# Patient Record
Sex: Female | Born: 1995 | Race: Black or African American | Hispanic: No | Marital: Single | Smoking: Never smoker
Health system: Southern US, Community
[De-identification: ages and names within clinical notes are randomized; demographics above are authoritative.]

---

## 2015-10-29 ENCOUNTER — Emergency Department
Admission: EM | Admit: 2015-10-29 | Discharge: 2015-10-29 | Disposition: A | Discharge status: Home / Self Care | Payer: Medicaid Other | Attending: Emergency Medicine | Admitting: Emergency Medicine

## 2015-10-29 ENCOUNTER — Encounter: Payer: Self-pay | Admitting: Emergency Medicine

## 2015-10-29 DIAGNOSIS — K529 Noninfective gastroenteritis and colitis, unspecified: Secondary | ICD-10-CM

## 2015-10-29 DIAGNOSIS — O9989 Other specified diseases and conditions complicating pregnancy, childbirth and the puerperium: Secondary | ICD-10-CM | POA: Diagnosis present

## 2015-10-29 DIAGNOSIS — O210 Mild hyperemesis gravidarum: Secondary | ICD-10-CM | POA: Insufficient documentation

## 2015-10-29 DIAGNOSIS — Z3A01 Less than 8 weeks gestation of pregnancy: Secondary | ICD-10-CM | POA: Diagnosis not present

## 2015-10-29 DIAGNOSIS — O99611 Diseases of the digestive system complicating pregnancy, first trimester: Secondary | ICD-10-CM | POA: Insufficient documentation

## 2015-10-29 LAB — URINALYSIS COMPLETE WITH MICROSCOPIC (ARMC ONLY)
Bacteria, UA: NONE SEEN
Bilirubin Urine: NEGATIVE
Glucose, UA: NEGATIVE mg/dL
Nitrite: NEGATIVE
Protein, ur: NEGATIVE mg/dL
Specific Gravity, Urine: 1.025 (ref 1.005–1.030)
pH: 5 (ref 5.0–8.0)

## 2015-10-29 LAB — HCG, QUANTITATIVE, PREGNANCY: hCG, Beta Chain, Quant, S: 68071 m[IU]/mL — ABNORMAL HIGH (ref <5)

## 2015-10-29 LAB — COMPREHENSIVE METABOLIC PANEL
ALT: 14 U/L (ref 14–54)
AST: 17 U/L (ref 15–41)
Albumin: 4.7 g/dL (ref 3.5–5.0)
Alkaline Phosphatase: 50 U/L (ref 38–126)
Anion gap: 8 (ref 5–15)
BUN: 9 mg/dL (ref 6–20)
CO2: 24 mmol/L (ref 22–32)
Calcium: 9.7 mg/dL (ref 8.9–10.3)
Chloride: 105 mmol/L (ref 101–111)
Creatinine, Ser: 0.78 mg/dL (ref 0.44–1.00)
GFR calc Af Amer: >60 mL/min (ref >60)
GFR calc non Af Amer: >60 mL/min (ref >60)
Glucose, Bld: 89 mg/dL (ref 65–99)
Potassium: 3.8 mmol/L (ref 3.5–5.1)
Sodium: 137 mmol/L (ref 135–145)
Total Bilirubin: 0.8 mg/dL (ref 0.3–1.2)
Total Protein: 8.5 g/dL — ABNORMAL HIGH (ref 6.5–8.1)

## 2015-10-29 LAB — CBC
HCT: 44.8 % (ref 35.0–47.0)
Hemoglobin: 15.1 g/dL (ref 12.0–16.0)
MCH: 30.2 pg (ref 26.0–34.0)
MCHC: 33.6 g/dL (ref 32.0–36.0)
MCV: 90 fL (ref 80.0–100.0)
Platelets: 291 10*3/uL (ref 150–440)
RBC: 4.99 MIL/uL (ref 3.80–5.20)
RDW: 12.2 % (ref 11.5–14.5)
WBC: 9.9 10*3/uL (ref 3.6–11.0)

## 2015-10-29 LAB — LIPASE, BLOOD: Lipase: 36 U/L (ref 11–51)

## 2015-10-29 LAB — POCT PREGNANCY, URINE: Preg Test, Ur: POSITIVE — AB

## 2015-10-29 MED ORDER — SODIUM CHLORIDE 0.9 % IV BOLUS (SEPSIS): 1000.0000 mL | Freq: Once | INTRAVENOUS | Status: DC

## 2015-10-29 MED ORDER — METOCLOPRAMIDE HCL 5 MG/ML IJ SOLN: 10.0000 mg | Freq: Once | INTRAMUSCULAR | Status: DC

## 2015-10-29 MED ORDER — SODIUM CHLORIDE 0.9 % IV BOLUS (SEPSIS)
1000.0000 mL | Freq: Once | INTRAVENOUS | Status: AC
  Administered 2015-10-29: 1000 mL via INTRAVENOUS

## 2015-10-29 MED ORDER — METOCLOPRAMIDE HCL 10 MG PO TABS
10.0000 mg | ORAL_TABLET | Freq: Three times a day (TID) | ORAL | Status: AC
Start: 2015-10-29 — End: 2016-10-28

## 2015-10-29 NOTE — Discharge Instructions (Signed)
Return to the emergency room. If you have significant abdominal pain, vaginal bleeding, persistent vomiting, significant bleeding in your bottom, or you feel worse in any way. Follow closely with primary care doctor

## 2015-10-29 NOTE — ED Notes (Signed)
Patient presents to the ED with lower abdominal pain that started this morning, vomiting and diarrhea.  Patient states she is [redacted] weeks pregnant.  Patient reports some nausea and vomiting with her pregnancy,denies prior diarrhea or abdominal pain.  Denies vaginal bleeding.  Patient is in no obvious distress at this time.  Patient reports vomiting more than 10 times today and more than 10 episodes of diarrhea.

## 2015-10-29 NOTE — ED Provider Notes (Addendum)
[facility] Emergency Department Provider Note  ____________________________________________   I have reviewed the triage vital signs and the nursing notes.   HISTORY  Chief Complaint Abdominal Pain; Diarrhea; and Emesis    HPI Ana Spencer is a 20 y.o. female who is in the first trimester pregnancy presents today with nausea vomiting diarrhea. She has had no pelvic pain, she does not have any vaginal bleeding or pregnancy related concerns. She has vomited more than 10 times today and had more than 10 episodes of clear diarrhea. She denies any melena bright red blood per rectum or hematemesis. Multiple acuity members have been here this week with the same complaint. Positive sick contacts. She denies fever. She has received IV fluid from triage and feels much better at this time.  History reviewed. No pertinent past medical history.  There are no active problems to display for this patient.   History reviewed. No pertinent past surgical history.  Current Outpatient Rx  Name  Route  Sig  Dispense  Refill  . metoCLOPramide (REGLAN) 10 MG tablet   Oral   Take 1 tablet (10 mg total) by mouth 3 (three) times daily with meals.   90 tablet   1     Allergies Review of patient's allergies indicates no known allergies.  No family history on file.  Social History Social History  Substance Use Topics  . Smoking status: Never Smoker   . Smokeless tobacco: None  . Alcohol Use: No    Review of Systems Constitutional: No fever/chills Eyes: No visual changes. ENT: No sore throat. No stiff neck no neck pain Cardiovascular: Denies chest pain. Respiratory: Denies shortness of breath. Gastrointestinal:  See history of present illness. Genitourinary: Negative for dysuria. Musculoskeletal: Negative lower extremity swelling Skin: Negative for rash. Neurological: Negative for headaches, focal weakness or numbness. 10-point ROS otherwise  negative.  ____________________________________________   PHYSICAL EXAM:  VITAL SIGNS: ED Triage Vitals  Enc Vitals Group     BP 10/29/15 1844 113/66 mmHg     Pulse Rate 10/29/15 1844 82     Resp 10/29/15 1844 20     Temp 10/29/15 1844 99.1 F (37.3 C)     Temp Source 10/29/15 1844 Oral     SpO2 10/29/15 1844 98 %     Weight 10/29/15 1844 140 lb (63.504 kg)     Height 10/29/15 1844  (1.575 m)     Head Cir --      Peak Flow --      Pain Score 10/29/15 1844 5     Pain Loc --      Pain Edu? --      Excl. in GC? --     Constitutional: Alert and oriented. Well appearing and in no acute distress. Eyes: Conjunctivae are normal. PERRL. EOMI. Head: Atraumatic. Nose: No congestion/rhinnorhea. Mouth/Throat: Mucous membranes are moist.  Oropharynx non-erythematous. Neck: No stridor.   Nontender with no meningismus Cardiovascular: Normal rate, regular rhythm. Grossly normal heart sounds.  Good peripheral circulation. Respiratory: Normal respiratory effort.  No retractions. Lungs CTAB. Abdominal: Soft and nontender. No distention. No guarding no rebound Back:  There is no focal tenderness or step off there is no midline tenderness there are no lesions noted. there is no CVA tenderness Musculoskeletal: No lower extremity tenderness. No joint effusions, no DVT signs strong distal pulses no edema Neurologic:  Normal speech and language. No gross focal neurologic deficits are appreciated.  Skin:  Skin is warm, dry and intact.  No rash noted. Psychiatric: Mood and affect are normal. Speech and behavior are normal.  ____________________________________________   LABS (all labs ordered are listed, but only abnormal results are displayed)  Labs Reviewed  COMPREHENSIVE METABOLIC PANEL - Abnormal; Notable for the following:    Total Protein 8.5 (*)    All other components within normal limits  LIPASE, BLOOD  CBC  URINALYSIS COMPLETEWITH MICROSCOPIC ([facility] ONLY)  HCG, QUANTITATIVE,  PREGNANCY  POC URINE PREG, ED   ____________________________________________  EKG  I personally interpreted any EKGs ordered by me or triage _______________________________________  RADIOLOGY  I reviewed any imaging ordered by me or triage that were performed during my shift ____________________________________________   PROCEDURES  Procedure(s) performed: None  Critical Care performed: None  ____________________________________________   INITIAL IMPRESSION / ASSESSMENT AND PLAN / ED COURSE  Pertinent labs & imaging results that were available during my care of the patient were reviewed by me and considered in my medical decision making (see chart for details).  Very well-appearing woman who has nausea vomiting diarrhea but no evidence of pregnancy related complaint. Already she feels and appears much better. In the last given her pregnancy status and will give her another bolus of IV fluid. Abdominal exam is completely benign on serial exams. ____________________________________________   FINAL CLINICAL IMPRESSION(S) / ED DIAGNOSES  Final diagnoses:  Gastroenteritis      This chart was dictated using voice recognition software.  Despite best efforts to proofread,  errors can occur which can change meaning.     Jeanmarie Plant, MD 10/29/15 1610  Jeanmarie Plant, MD 10/29/15 2039

## 2015-11-02 ENCOUNTER — Encounter: Payer: Self-pay | Admitting: Emergency Medicine

## 2015-11-02 ENCOUNTER — Emergency Department: Payer: Medicaid Other

## 2015-11-02 ENCOUNTER — Emergency Department
Admission: EM | Admit: 2015-11-02 | Discharge: 2015-11-02 | Disposition: A | Discharge status: Home / Self Care | Payer: Medicaid Other | Attending: Emergency Medicine | Admitting: Emergency Medicine

## 2015-11-02 DIAGNOSIS — O209 Hemorrhage in early pregnancy, unspecified: Secondary | ICD-10-CM | POA: Diagnosis present

## 2015-11-02 DIAGNOSIS — Z79899 Other long term (current) drug therapy: Secondary | ICD-10-CM | POA: Insufficient documentation

## 2015-11-02 DIAGNOSIS — Z3A11 11 weeks gestation of pregnancy: Secondary | ICD-10-CM | POA: Diagnosis not present

## 2015-11-02 DIAGNOSIS — N920 Excessive and frequent menstruation with regular cycle: Secondary | ICD-10-CM

## 2015-11-02 DIAGNOSIS — O039 Complete or unspecified spontaneous abortion without complication: Secondary | ICD-10-CM | POA: Insufficient documentation

## 2015-11-02 LAB — URINALYSIS COMPLETE WITH MICROSCOPIC (ARMC ONLY)
BACTERIA UA: NONE SEEN
SPECIFIC GRAVITY, URINE: 1.026 (ref 1.005–1.030)

## 2015-11-02 LAB — CHLAMYDIA/NGC RT PCR (ARMC ONLY)
Chlamydia Tr: DETECTED — AB
N GONORRHOEAE: DETECTED — AB

## 2015-11-02 LAB — WET PREP, GENITAL
Clue Cells Wet Prep HPF POC: NONE SEEN
SPERM: NONE SEEN
TRICH WET PREP: NONE SEEN
YEAST WET PREP: NONE SEEN

## 2015-11-02 LAB — HCG, QUANTITATIVE, PREGNANCY: hCG, Beta Chain, Quant, S: 24154 m[IU]/mL — ABNORMAL HIGH (ref <5)

## 2015-11-02 LAB — ABO/RH: ABO/RH(D): O POS

## 2015-11-02 MED ORDER — AMOXICILLIN-POT CLAVULANATE 875-125 MG PO TABS: 1.0000 | ORAL_TABLET | Freq: Two times a day (BID) | ORAL | Status: AC

## 2015-11-02 MED ORDER — METHYLERGONOVINE MALEATE 0.2 MG PO TABS
ORAL_TABLET | ORAL | Status: AC
Start: 2015-11-02 — End: 2015-11-03

## 2015-11-02 MED ORDER — METHYLERGONOVINE MALEATE 0.2 MG/ML IJ SOLN
0.2000 mg | Freq: Once | INTRAMUSCULAR | Status: AC
  Administered 2015-11-02: 0.2 mg via INTRAMUSCULAR
  Filled 2015-11-02: qty 1

## 2015-11-02 MED ORDER — AMOXICILLIN-POT CLAVULANATE 875-125 MG PO TABS
1.0000 | ORAL_TABLET | Freq: Once | ORAL | Status: AC
  Administered 2015-11-02: 1 via ORAL
  Filled 2015-11-02: qty 1

## 2015-11-02 NOTE — Discharge Instructions (Signed)
Miscarriage °A miscarriage is the sudden loss of an unborn baby (fetus) before the 20th week of pregnancy. Most miscarriages happen in the first 3 months of pregnancy. Sometimes, it happens before a woman even knows she is pregnant. A miscarriage is also called a "spontaneous miscarriage" or "early pregnancy loss." Having a miscarriage can be an emotional experience. Talk with your caregiver about any questions you may have about miscarrying, the grieving process, and your future pregnancy plans. °CAUSES  °· Problems with the fetal chromosomes that make it impossible for the baby to develop normally. Problems with the baby's genes or chromosomes are most often the result of errors that occur, by chance, as the embryo divides and grows. The problems are not inherited from the parents. °· Infection of the cervix or uterus.   °· Hormone problems.   °· Problems with the cervix, such as having an incompetent cervix. This is when the tissue in the cervix is not strong enough to hold the pregnancy.   °· Problems with the uterus, such as an abnormally shaped uterus, uterine fibroids, or congenital abnormalities.   °· Certain medical conditions.   °· Smoking, drinking alcohol, or taking illegal drugs.   °· Trauma.   °Often, the cause of a miscarriage is unknown.  °SYMPTOMS  °· Vaginal bleeding or spotting, with or without cramps or pain. °· Pain or cramping in the abdomen or lower back. °· Passing fluid, tissue, or blood clots from the vagina. °DIAGNOSIS  °Your caregiver will perform a physical exam. You may also have an ultrasound to confirm the miscarriage. Blood or urine tests may also be ordered. °TREATMENT  °· Sometimes, treatment is not necessary if you naturally pass all the fetal tissue that was in the uterus. If some of the fetus or placenta remains in the body (incomplete miscarriage), tissue left behind may become infected and must be removed. Usually, a dilation and curettage (D and C) procedure is performed.  During a D and C procedure, the cervix is widened (dilated) and any remaining fetal or placental tissue is gently removed from the uterus. °· Antibiotic medicines are prescribed if there is an infection. Other medicines may be given to reduce the size of the uterus (contract) if there is a lot of bleeding. °· If you have Rh negative blood and your baby was Rh positive, you will need a Rh immunoglobulin shot. This shot will protect any future baby from having Rh blood problems in future pregnancies. °HOME CARE INSTRUCTIONS  °· Your caregiver may order bed rest or may allow you to continue light activity. Resume activity as directed by your caregiver. °· Have someone help with home and family responsibilities during this time.   °· Keep track of the number of sanitary pads you use each day and how soaked (saturated) they are. Write down this information.   °· Do not use tampons. Do not douche or have sexual intercourse until approved by your caregiver.   °· Only take over-the-counter or prescription medicines for pain or discomfort as directed by your caregiver.   °· Do not take aspirin. Aspirin can cause bleeding.   °· Keep all follow-up appointments with your caregiver.   °· If you or your partner have problems with grieving, talk to your caregiver or seek counseling to help cope with the pregnancy loss. Allow enough time to grieve before trying to get pregnant again.   °SEEK IMMEDIATE MEDICAL CARE IF:  °· You have severe cramps or pain in your back or abdomen. °· You have a fever. °· You pass large blood clots (walnut-sized   or larger) ortissue from your vagina. Save any tissue for your caregiver to inspect.   Your bleeding increases.   You have a thick, bad-smelling vaginal discharge.  You become lightheaded, weak, or you faint.   You have chills.  MAKE SURE YOU:  Understand these instructions.  Will watch your condition.  Will get help right away if you are not doing well or get worse.   This  information is not intended to replace advice given to you by your health care provider. Make sure you discuss any questions you have with your health care provider.   Document Released: 02/23/2001 Document Revised: 12/25/2012 Document Reviewed: 10/19/2011 Elsevier Interactive Patient Education 2016 [employer].   Take the Augmentin 1 pill twice a day to help with a urinary tract infection. Take the Methergine one pill every 6 hours to help make sure the bleeding slows down and stops completely doesn't come back. Please return for fever or worse pain and heavy bleeding or lightheadedness. Please call [facility] in the morning to schedule follow-up later this week.

## 2015-11-02 NOTE — ED Notes (Addendum)
Pt reports vaginal bleeding and lower abdominal cramping that started last night. Pain is to mid lower abdomen. 2 pads today so far. Last period in November. G1P0. Has not seen OB yet; appt is this Friday. Pain has not subsided.

## 2015-11-02 NOTE — ED Provider Notes (Signed)
[facility] Emergency Department Provider Note  ____________________________________________  Time seen: Approximately 3:45 PM  I have reviewed the triage vital signs and the nursing notes.   HISTORY  Chief Complaint Vaginal Bleeding    HPI Ana Spencer is a 20 y.o. female patient reports cramping and bleeding lower abdominal cramping she is about [redacted] weeks pregnant. Then while getting await an ultrasound delivered a products of conception fetus she did not get the hands vaginal portion of the inner of the ultrasound blood type is O+ patient says UTI.  History reviewed. No pertinent past medical history.  There are no active problems to display for this patient.   History reviewed. No pertinent past surgical history.  Current Outpatient Rx  Name  Route  Sig  Dispense  Refill  . amoxicillin-clavulanate (AUGMENTIN) 875-125 MG tablet   Oral   Take 1 tablet by mouth every 12 (twelve) hours.   20 tablet   0   . methylergonovine (METHERGINE) 0.2 MG tablet      TAKE 1 PILL 4 X A DAY FOR 24 HOURS   3 tablet   0   . metoCLOPramide (REGLAN) 10 MG tablet   Oral   Take 1 tablet (10 mg total) by mouth 3 (three) times daily with meals.   90 tablet   1     Allergies Review of patient's allergies indicates no known allergies.  History reviewed. No pertinent family history.  Social History Social History  Substance Use Topics  . Smoking status: Never Smoker   . Smokeless tobacco: None  . Alcohol Use: No    Review of Systems Constitutional: No fever/chills Eyes: No visual changes. ENT: No sore throat. Cardiovascular: Denies chest pain. Respiratory: Denies shortness of breath. Gastrointestinal: No abdominal pain.  No nausea, no vomiting.  No diarrhea.  No constipation. Genitourinary: Negative for dysuria. Musculoskeletal: Negative for back pain. Skin: Negative for rash. Neurological: Negative for headaches, focal weakness or  numbness.  10-point ROS otherwise negative.  ____________________________________________   PHYSICAL EXAM:  VITAL SIGNS: ED Triage Vitals  Enc Vitals Group     BP 11/02/15 1256 129/85 mmHg     Pulse Rate 11/02/15 1256 104     Resp 11/02/15 1256 18     Temp 11/02/15 1256 98.3 F (36.8 C)     Temp Source 11/02/15 1256 Oral     SpO2 11/02/15 1256 99 %     Weight 11/02/15 1256 140 lb (63.504 kg)     Height 11/02/15 1256  (1.575 m)     Head Cir --      Peak Flow --      Pain Score --      Pain Loc --      Pain Edu? --      Excl. in GC? --     Constitutional: Alert and oriented. Well appearing and in no acute distress. Eyes: Conjunctivae are normal. PERRL. EOMI. Head: Atraumatic. Nose: No congestion/rhinnorhea. Mouth/Throat: Mucous membranes are moist.  Oropharynx non-erythematous. Neck: No stridor. Cardiovascular: Normal rate, regular rhythm. Grossly normal heart sounds.  Good peripheral circulation. Respiratory: Normal respiratory effort.  No retractions. Lungs CTAB. Gastrointestinal: Soft and nontender. No distention. No abdominal bruits. No CVA tenderness. Genitourinary: Normal perineum there is some blood in the vagina*there is no active bleeding. The cervix is open to fingertip there is no cervical motion tenderness or adnexal tenderness uterus is perhaps slightly enlarged. Musculoskeletal: No lower extremity tenderness nor edema.  No joint effusions. Neurologic:  Normal speech and language. No gross focal neurologic deficits are appreciated. No gait instability. Skin:  Skin is warm, dry and intact. No rash noted. Psychiatric: Mood and affect are normal. Speech and behavior are normal.  ____________________________________________   LABS (all labs ordered are listed, but only abnormal results are displayed)  Labs Reviewed  WET PREP, GENITAL - Abnormal; Notable for the following:    WBC, Wet Prep HPF POC MODERATE (*)    All other components within normal limits   CHLAMYDIA/NGC RT PCR ([facility] ONLY) - Abnormal; Notable for the following:    Chlamydia Tr DETECTED (*)    N gonorrhoeae DETECTED (*)    All other components within normal limits  HCG, QUANTITATIVE, PREGNANCY - Abnormal; Notable for the following:    hCG, Beta Chain, Quant, S 02725 (*)    All other components within normal limits  URINALYSIS COMPLETEWITH MICROSCOPIC ([facility] ONLY) - Abnormal; Notable for the following:    Color, Urine RED (*)    APPearance HAZY (*)    Glucose, UA   (*)    Value: TEST NOT REPORTED DUE TO COLOR INTERFERENCE OF URINE PIGMENT   Bilirubin Urine   (*)    Value: TEST NOT REPORTED DUE TO COLOR INTERFERENCE OF URINE PIGMENT   Ketones, ur   (*)    Value: TEST NOT REPORTED DUE TO COLOR INTERFERENCE OF URINE PIGMENT   Hgb urine dipstick   (*)    Value: TEST NOT REPORTED DUE TO COLOR INTERFERENCE OF URINE PIGMENT   Protein, ur   (*)    Value: TEST NOT REPORTED DUE TO COLOR INTERFERENCE OF URINE PIGMENT   Nitrite   (*)    Value: TEST NOT REPORTED DUE TO COLOR INTERFERENCE OF URINE PIGMENT   Leukocytes, UA   (*)    Value: TEST NOT REPORTED DUE TO COLOR INTERFERENCE OF URINE PIGMENT   Squamous Epithelial / LPF 6-30 (*)    All other components within normal limits  ABO/RH   ____________________________________________  EKG   ____________________________________________  RADIOLOGY  Ultrasound shows no pregnancy however there is some material in the fundus of the uterus which could represent retained products. ____________________________________________   PROCEDURES    ____________________________________________   INITIAL IMPRESSION / ASSESSMENT AND PLAN / ED COURSE  Pertinent labs & imaging results that were available during my care of the patient were reviewed by me and considered in my medical decision making (see chart for details). Patient is no longer having any cramping or bleeding. Discussed with Dr. Tiburcio Pea on call for [facility] who recommends giving her Methergine 0.2 mg every 6 hours for 24 hours for doses and follow-up with him this week. ____________________________________________   FINAL CLINICAL IMPRESSION(S) / ED DIAGNOSES  Final diagnoses:  Miscarriage      Arnaldo Natal, MD 11/02/15 2026

## 2017-12-01 IMAGING — US US OB COMP LESS 14 WK
1 series · 14 of 28 positions shown · non-contrast
Comparison: None.

CLINICAL DATA: Bright red vaginal spotting for 1 day. Twelve weeks
and 3 days pregnant by last menstrual period. Quantitative beta HCG
[DATE]. This was 68,071 four days ago.

EXAM:
OBSTETRIC <14 WK ULTRASOUND
TECHNIQUE: Transabdominal ultrasound was performed for evaluation of the
gestation as well as the maternal uterus and adnexal regions.

[Series 1: us ob comp less 14 wk · 0.15mm/px · 14 of 33 slices shown]
[im 2/33]
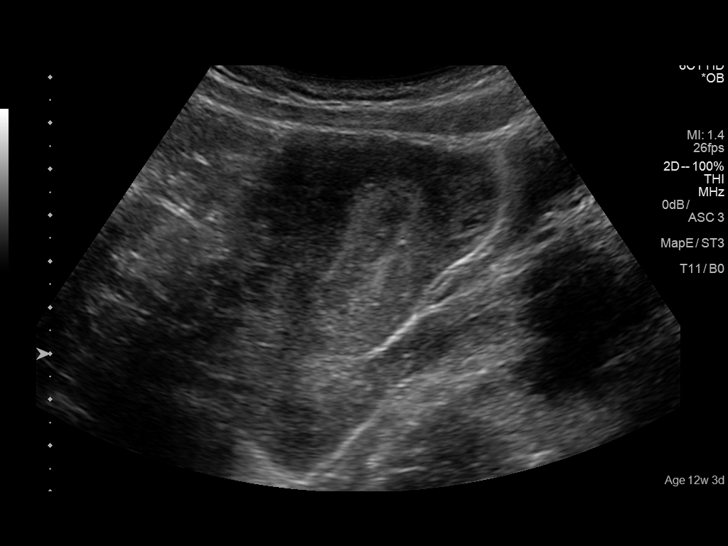
[im 4/33]
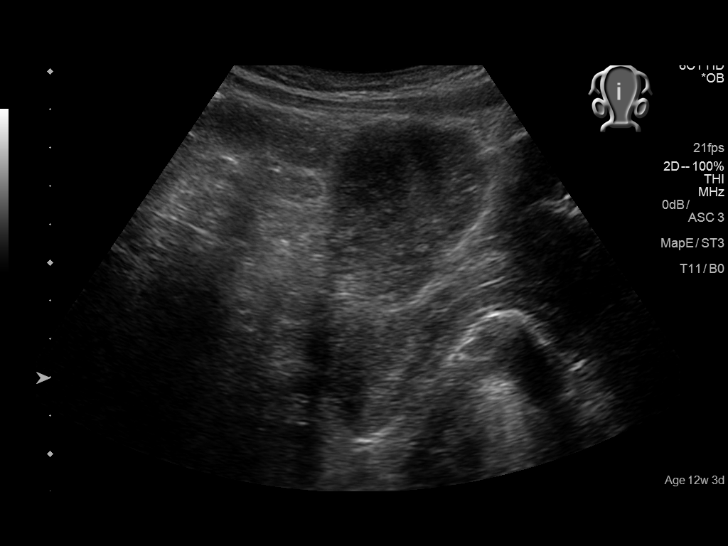
[im 6/33]
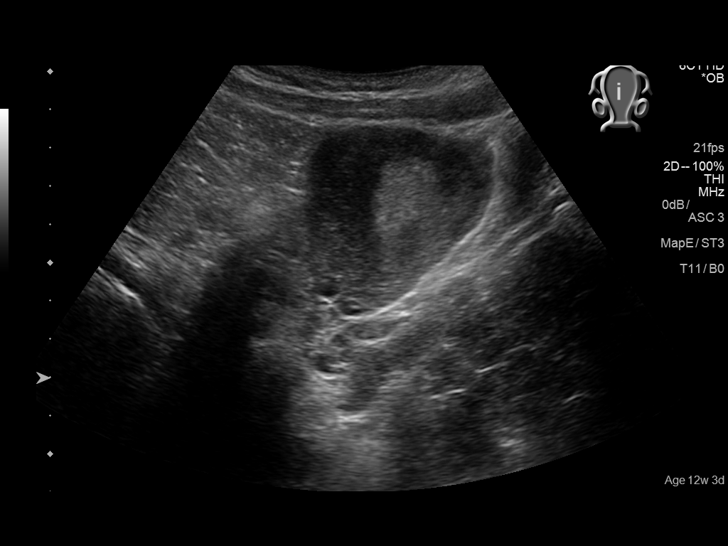
[im 9/33]
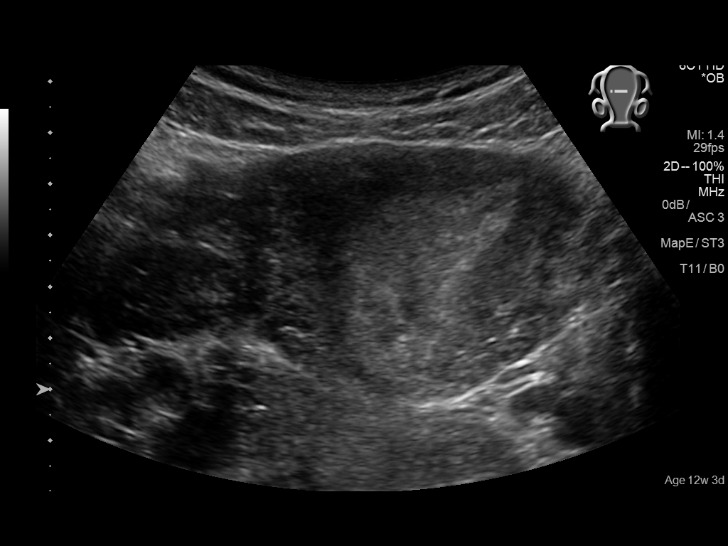
[im 11/33]
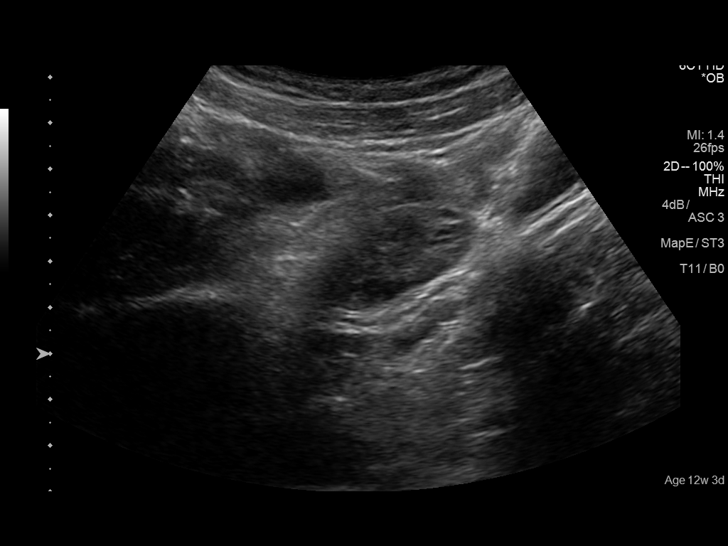
[im 14/33]
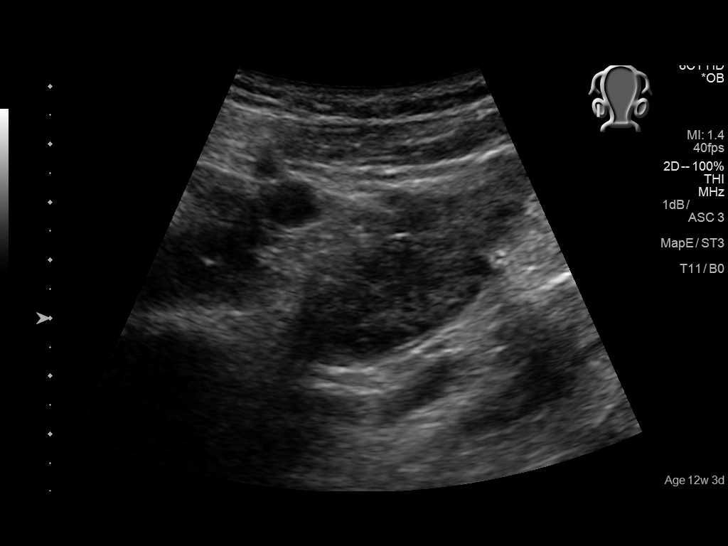
[im 16/33]
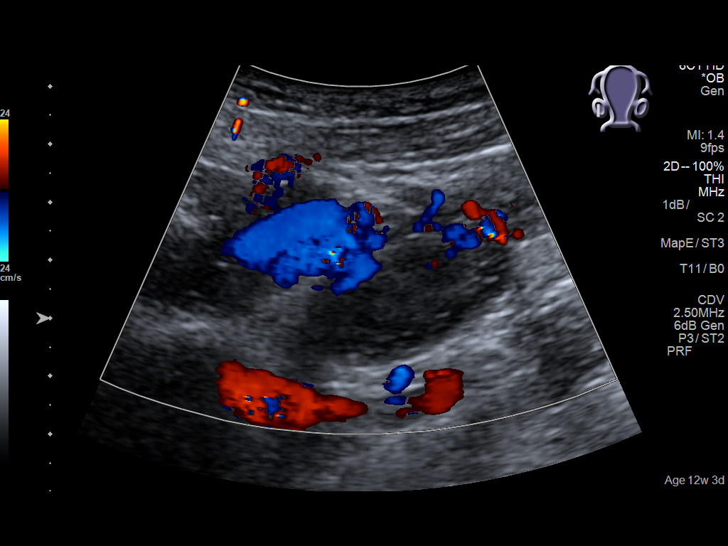
[im 18/33]
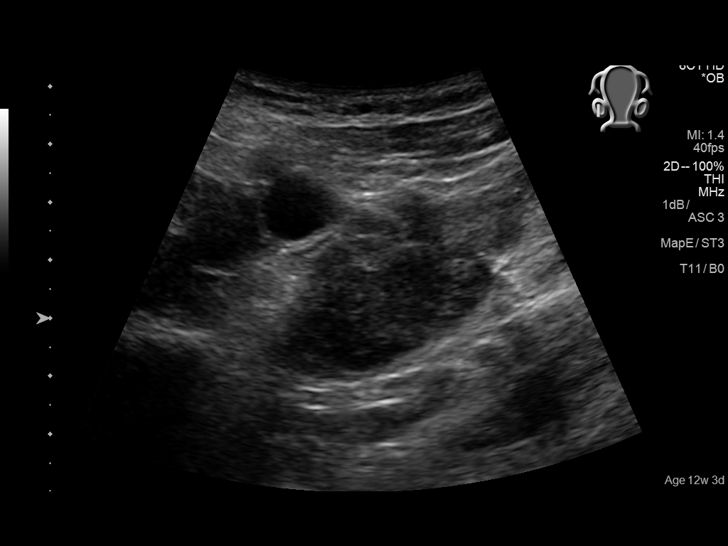
[im 21/33]
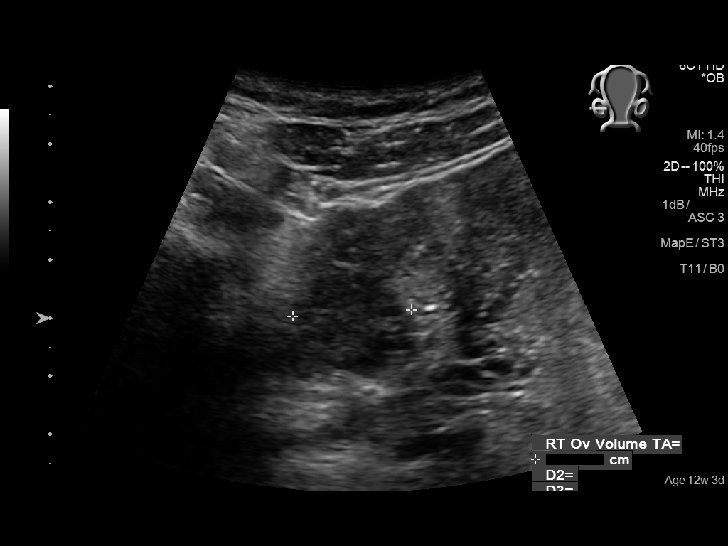
[im 23/33]
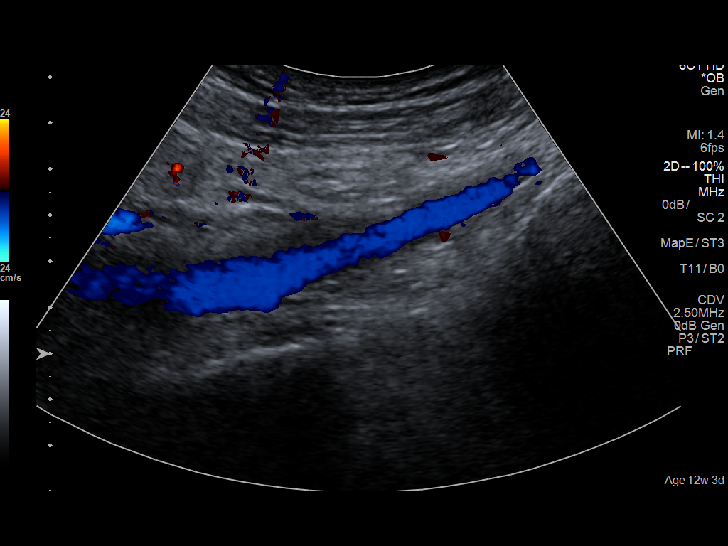
[im 25/33]
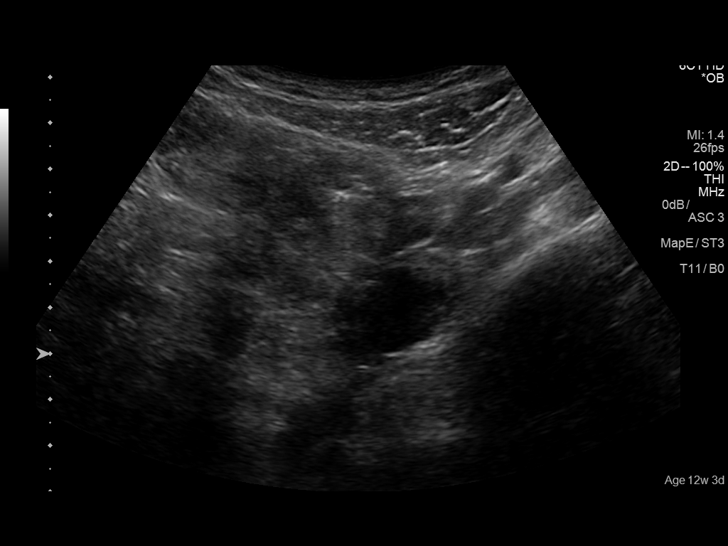
[im 28/33]
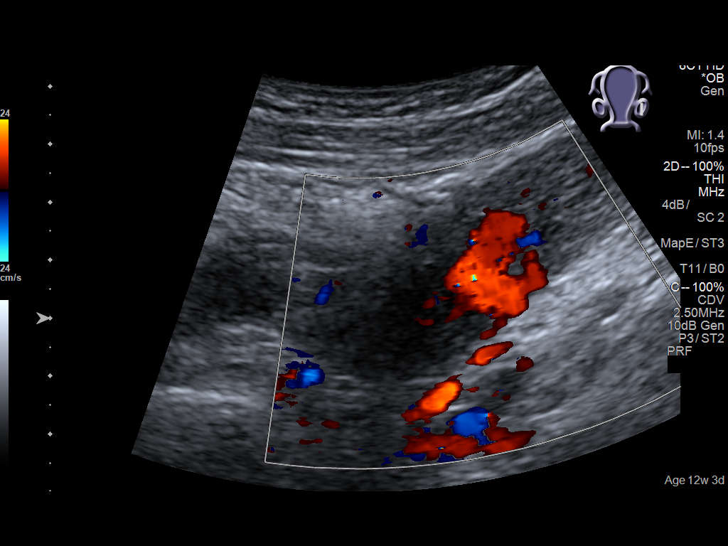
[im 30/33]
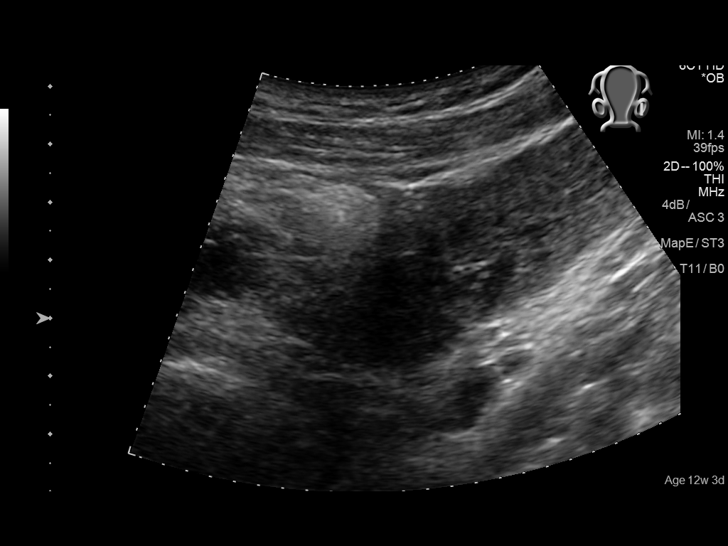
[im 33/33]
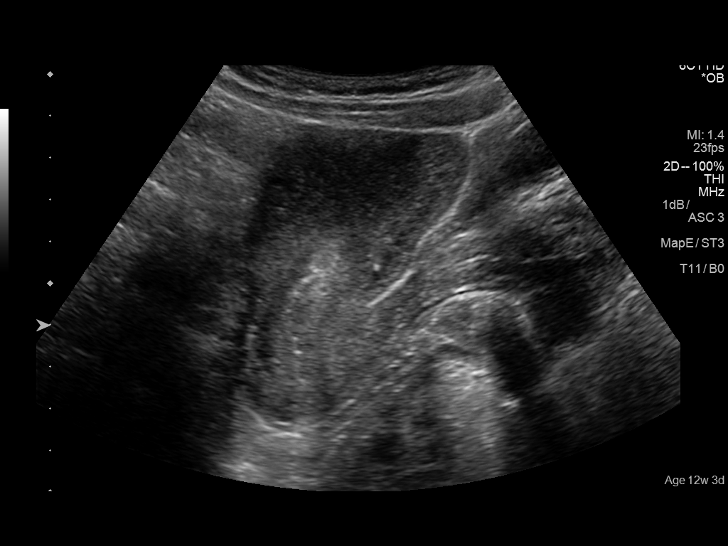

[14 of 28 positions shown; findings below may reference images not displayed]

FINDINGS: Intrauterine gestational sac: Not visualized

Maternal uterus/adnexae: Diffusely thickened endometrium measuring
15.1 mm in maximum thickness transvaginally. Mild, heterogeneous,
decreased echogenicity within the central endometrium inferiorly.
Normal appearing ovaries. No free peritoneal fluid.

Note: When the patient went to the bathroom to verbal weight for the
transvaginal examination, a dead fetus appeared. She then refused to
have the transvaginal portion.
IMPRESSION: 1. No intrauterine or extrauterine pregnancy demonstrated.
2. An aborted fetus appeared during the examination, as described
above.
3. Mildly heterogeneous hypoechogenicity in the central endometrium
in the lower uterine segment. This could represent blood or retained
products of conception.

## 2019-04-04 ENCOUNTER — Other Ambulatory Visit: Payer: Self-pay

## 2019-04-04 DIAGNOSIS — Z20822 Contact with and (suspected) exposure to covid-19: Secondary | ICD-10-CM

## 2019-04-07 LAB — NOVEL CORONAVIRUS, NAA: SARS-CoV-2, NAA: NOT DETECTED
